# Patient Record
Sex: Female | Born: 1985
Health system: Southern US, Community
[De-identification: ages and names within clinical notes are randomized; demographics above are authoritative.]

---

## 2018-11-05 MED FILL — ONDANSETRON HCL 4 MG TABLET: 4 | 4 days supply | Qty: 15 | Fill #0

## 2019-05-16 ENCOUNTER — Emergency Department (HOSPITAL_COMMUNITY): Payer: PRIVATE HEALTH INSURANCE

## 2019-05-16 ENCOUNTER — Other Ambulatory Visit: Payer: Self-pay

## 2019-05-16 ENCOUNTER — Encounter (HOSPITAL_COMMUNITY): Payer: Self-pay | Admitting: Emergency Medicine

## 2019-05-16 ENCOUNTER — Emergency Department (HOSPITAL_COMMUNITY)
Admission: EM | Admit: 2019-05-16 | Discharge: 2019-05-17 | Disposition: A | Discharge status: Home / Self Care | Payer: PRIVATE HEALTH INSURANCE | Attending: Emergency Medicine | Admitting: Emergency Medicine

## 2019-05-16 DIAGNOSIS — R0602 Shortness of breath: Secondary | ICD-10-CM | POA: Insufficient documentation

## 2019-05-16 DIAGNOSIS — R0781 Pleurodynia: Secondary | ICD-10-CM | POA: Diagnosis not present

## 2019-05-16 DIAGNOSIS — R0789 Other chest pain: Secondary | ICD-10-CM | POA: Diagnosis present

## 2019-05-16 DIAGNOSIS — Z20828 Contact with and (suspected) exposure to other viral communicable diseases: Secondary | ICD-10-CM | POA: Diagnosis not present

## 2019-05-16 LAB — I-STAT BETA HCG BLOOD, ED (MC, WL, AP ONLY): I-stat hCG, quantitative: <5 m[IU]/mL (ref <5)

## 2019-05-16 NOTE — ED Provider Notes (Signed)
TIME SEEN: 11:26 PM  CHIEF COMPLAINT: Chest pain, shortness of breath  HPI: Patient is a 33 year old female with no significant past medical history who presents to the emergency department with 2 days of chest pain and shortness of breath.  States the pain is a pressure in the center of her chest that has radiated into her back that is worse with deep inspiration.  States that she feels short of breath especially lying flat.  States she is normally very active and exercises regularly.  She has not exercise though since the symptoms have started.  She has a [place] IUD but no exogenous estrogen.  She is not a smoker.  She has never had a PE or DVT before.  No recent prolonged immobilization such as long travel, surgery, fracture or hospitalization.  She denies any fevers although does have a temperature here of 99.  She denies any cough.  Patient works as an Geneticist, molecular.  She is exposed to coronavirus patient.  ROS: See HPI Constitutional: no fever  Eyes: no drainage  ENT: no runny nose   Cardiovascular:   chest pain  Resp:  SOB  GI: no vomiting GU: no dysuria Integumentary: no rash  Allergy: no hives  Musculoskeletal: no leg swelling  Neurological: no slurred speech ROS otherwise negative  PAST MEDICAL HISTORY/PAST SURGICAL HISTORY:  History reviewed. No pertinent past medical history.  MEDICATIONS:  Prior to Admission medications   Not on File    ALLERGIES:  No Known Allergies  SOCIAL HISTORY:  Social History   Tobacco Use  . Smoking status: Never Smoker  . Smokeless tobacco: Never Used  Substance Use Topics  . Alcohol use: Yes    FAMILY HISTORY: No family history on file.  EXAM: BP 119/80 (BP Location: Right Arm)   Pulse 88   Temp 99 F (37.2 C) (Oral)   Resp 16   Ht 5\' 7"  (1.702 m)   Wt 74.8 kg   LMP  (Exact Date)   SpO2 100%   BMI 25.84 kg/m  CONSTITUTIONAL: Alert and oriented and responds appropriately to questions.  Well-appearing; well-nourished HEAD: Normocephalic EYES: Conjunctivae clear, pupils appear equal ENT: normal nose; moist mucous membranes NECK: Supple, normal range of motion CARD: RRR; S1 and S2 appreciated; no murmurs, no clicks, no rubs, no gallops RESP: Normal chest excursion without splinting or tachypnea; breath sounds clear and equal bilaterally; no wheezes, no rhonchi, no rales, no hypoxia or respiratory distress, speaking full sentences ABD/GI: Nondistended BACK:  The back appears normal with normal range of motion EXT: Normal ROM in all joints; non-tender to palpation; no edema; normal capillary refill; no cyanosis, no calf tenderness or swelling    SKIN: Normal color for age and race; warm; no rash NEURO: Moves all extremities equally PSYCH: The patient's mood and manner are appropriate. Grooming and personal hygiene are appropriate.  MEDICAL DECISION MAKING: Patient here with pleuritic chest pain.  Differential includes PE, coronavirus, pneumonia, pleurisy.  Less likely ACS given patient is otherwise healthy and exercises regularly.  Will obtain labs, chest x-ray.  ED PROGRESS: Patient's EKG shows no ischemic abnormality.  Labs are reassuring.  D-dimer negative.  Troponin negative.  Chest x-ray clear.  I have recommended coronavirus testing given patient works here in the hospital and has a temperature of 99.  She agrees to this plan.  She is out of work for the next several days and will follow-up on her results through my chart.  Have given Toradol here  for pain control and recommended alternating Tylenol and ibuprofen at home.  She states Tylenol did help her pain at home.  Discussed return precautions.  I feel patient is safe for discharge.   At this time, I do not feel there is any life-threatening condition present. I have reviewed and discussed all results (EKG, imaging, lab, urine as appropriate) and exam findings with patient/family. I have reviewed nursing notes and  appropriate previous records.  I feel the patient is safe to be discharged home without further emergent workup and can continue workup as an outpatient as needed. Discussed usual and customary return precautions. Patient/family verbalize understanding and are comfortable with this plan.  Outpatient follow-up has been provided as needed. All questions have been answered.      EKG Interpretation  Date/Time:  Thursday May 16 2019 [id number] EDT Ventricular Rate:  70 PR Interval:    QRS Duration: 108 QT Interval:  388 QTC Calculation: 419 R Axis:   57 Text Interpretation:  Sinus rhythm RSR' in V1 or V2, right VCD or RVH No old tracing to compare Confirmed by Dione BoozeGlick, David ([id number]) on 05/16/2019 11:11:51 PM         , Layla MawKristen N, DO 05/17/19 0122

## 2019-05-17 LAB — CBC WITH DIFFERENTIAL/PLATELET
Abs Immature Granulocytes: 0.05 10*3/uL (ref 0.00–0.07)
Basophils Absolute: 0.1 10*3/uL (ref 0.0–0.1)
Basophils Relative: 1 %
Eosinophils Absolute: 0.1 10*3/uL (ref 0.0–0.5)
Eosinophils Relative: 1 %
HCT: 37.7 % (ref 36.0–46.0)
Hemoglobin: 13 g/dL (ref 12.0–15.0)
Immature Granulocytes: 1 %
Lymphocytes Relative: 41 %
Lymphs Abs: 3.3 10*3/uL (ref 0.7–4.0)
MCH: 30.3 pg (ref 26.0–34.0)
MCHC: 34.5 g/dL (ref 30.0–36.0)
MCV: 87.9 fL (ref 80.0–100.0)
Monocytes Absolute: 0.7 10*3/uL (ref 0.1–1.0)
Monocytes Relative: 9 %
Neutro Abs: 3.8 10*3/uL (ref 1.7–7.7)
Neutrophils Relative %: 47 %
Platelets: 331 10*3/uL (ref 150–400)
RBC: 4.29 MIL/uL (ref 3.87–5.11)
RDW: 12.2 % (ref 11.5–15.5)
WBC: 8 10*3/uL (ref 4.0–10.5)
nRBC: 0 % (ref 0.0–0.2)

## 2019-05-17 LAB — BASIC METABOLIC PANEL
Anion gap: 9 (ref 5–15)
BUN: 11 mg/dL (ref 6–20)
CO2: 24 mmol/L (ref 22–32)
Calcium: 9.4 mg/dL (ref 8.9–10.3)
Chloride: 105 mmol/L (ref 98–111)
Creatinine, Ser: 0.68 mg/dL (ref 0.44–1.00)
GFR calc Af Amer: >60 mL/min (ref >60)
GFR calc non Af Amer: >60 mL/min (ref >60)
Glucose, Bld: 89 mg/dL (ref 70–99)
Potassium: 3.5 mmol/L (ref 3.5–5.1)
Sodium: 138 mmol/L (ref 135–145)

## 2019-05-17 LAB — TROPONIN I: Troponin I: <0.03 ng/mL (ref <0.03)

## 2019-05-17 LAB — D-DIMER, QUANTITATIVE: D-Dimer, Quant: 0.29 ug/mL-FEU (ref 0.00–0.50)

## 2019-05-17 MED ORDER — KETOROLAC TROMETHAMINE 30 MG/ML IJ SOLN
60.0000 mg | Freq: Once | INTRAMUSCULAR | Status: AC
  Administered 2019-05-17: 60 mg via INTRAMUSCULAR
  Filled 2019-05-17: qty 2

## 2019-05-17 NOTE — Discharge Instructions (Addendum)
You may alternate Tylenol 1000 mg every 6 hours as needed for pain and Ibuprofen 800 mg every 8 hours as needed for pain.  Please take Ibuprofen with food. ° °

## 2019-05-18 LAB — NOVEL CORONAVIRUS, NAA (HOSP ORDER, SEND-OUT TO REF LAB; TAT 18-24 HRS): SARS-CoV-2, NAA: NOT DETECTED

## 2019-10-21 IMAGING — CR CHEST - 2 VIEW
2 series · 2 of 2 positions shown · non-contrast
Comparison: None.

CLINICAL DATA: Chest pain, short of breath

EXAM:
CHEST - 2 VIEW

[chest pa]
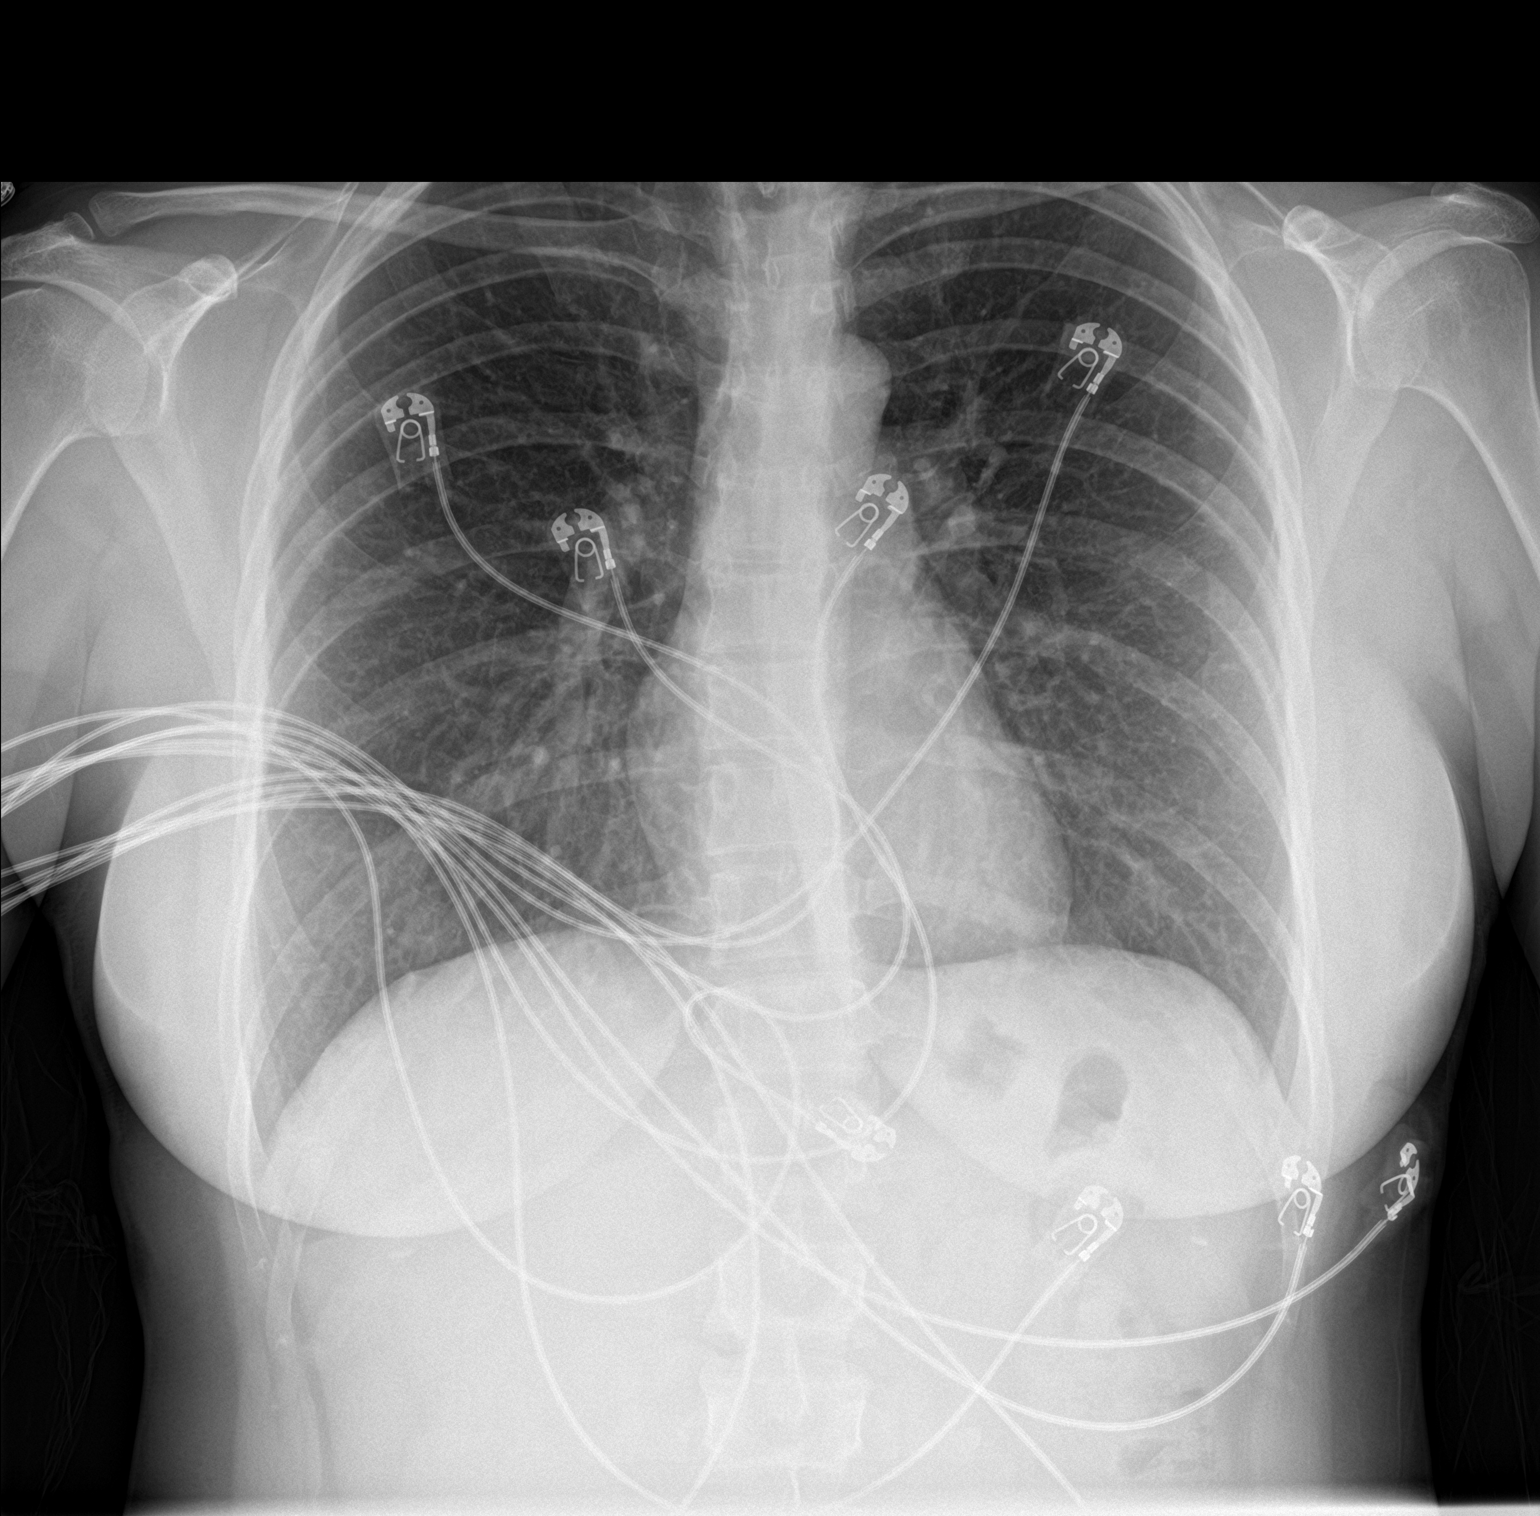

[chest lat]
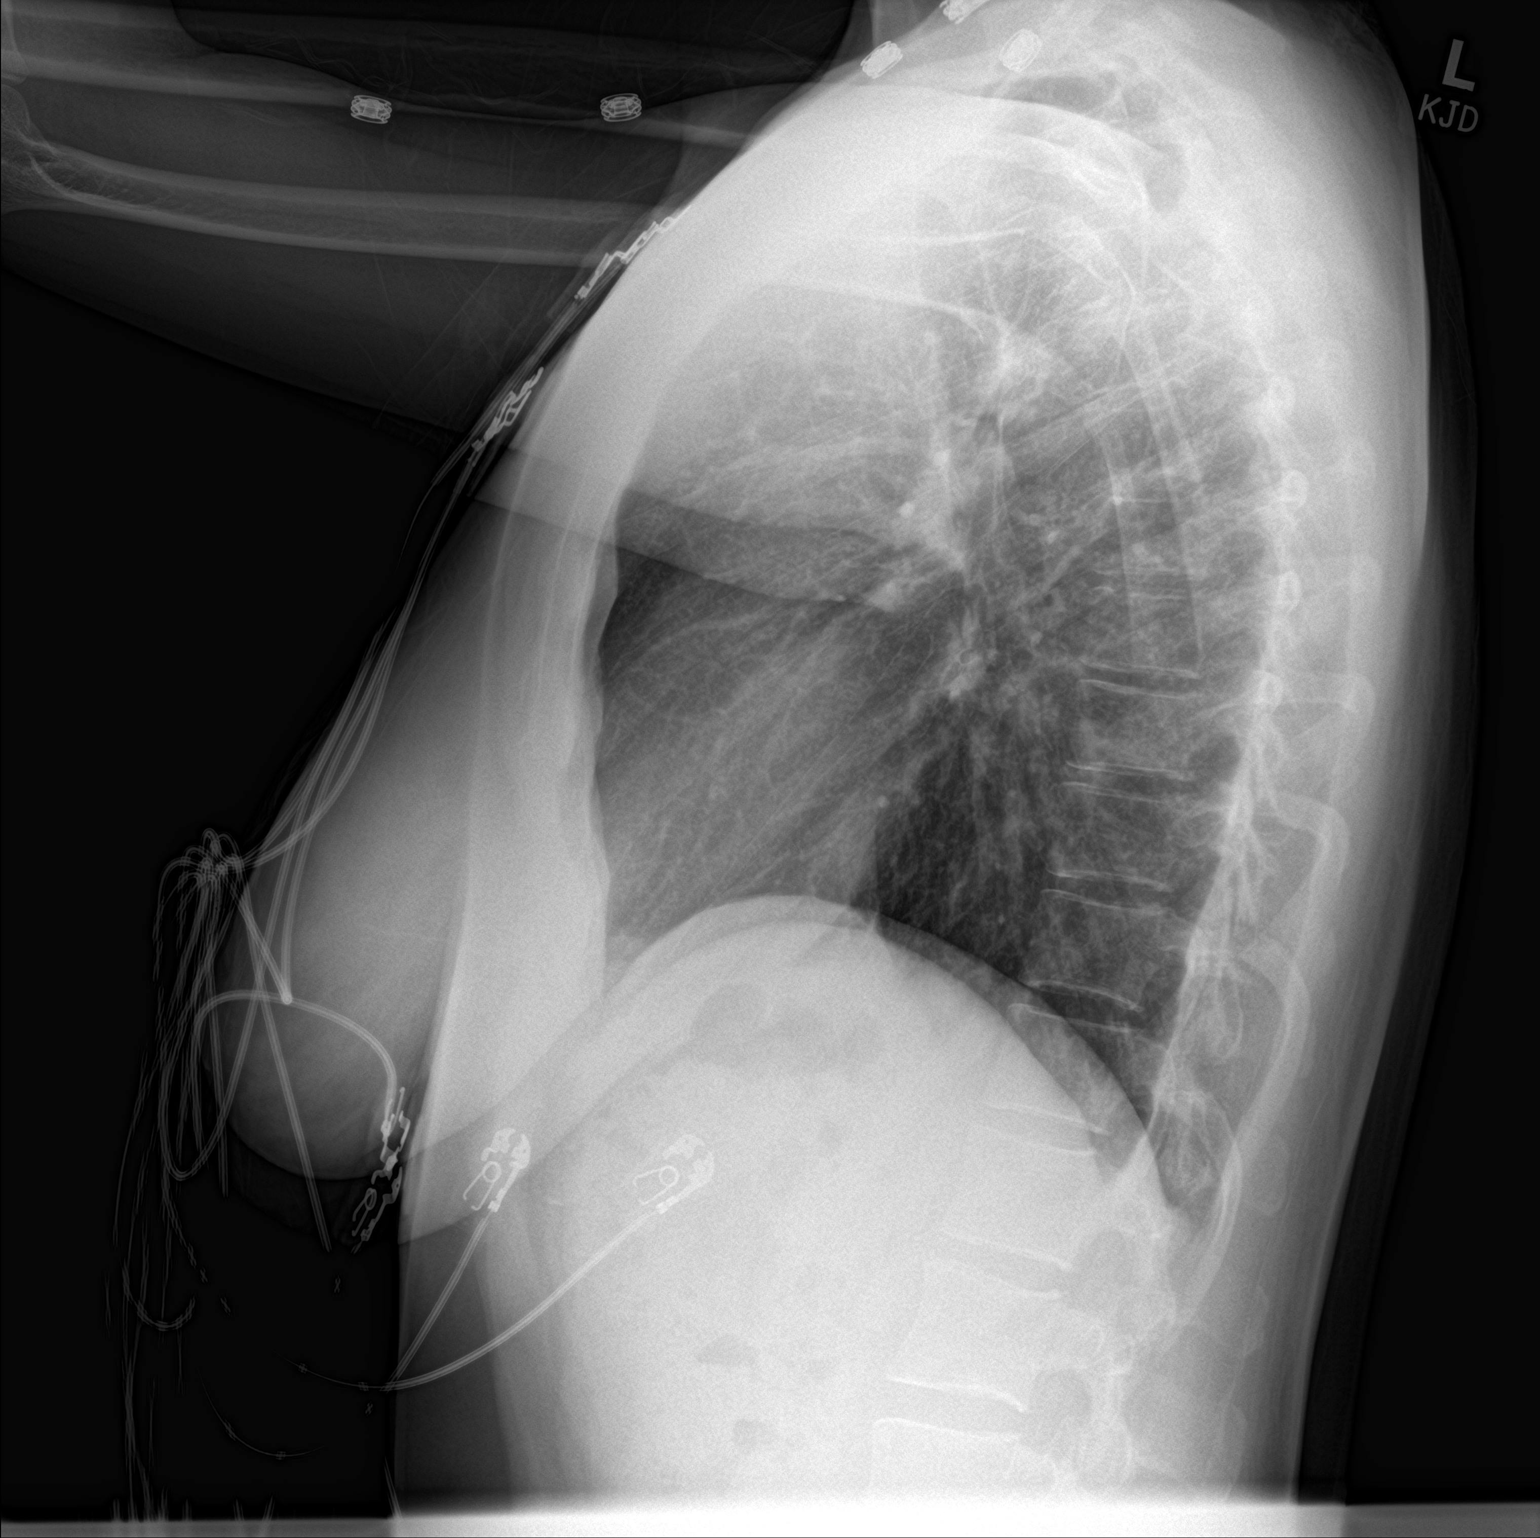

[2 of 2 positions shown; findings below may reference images not displayed]

FINDINGS: The heart size and mediastinal contours are within normal limits.
Both lungs are clear. The visualized skeletal structures are
unremarkable.
IMPRESSION: No active cardiopulmonary disease.

## 2021-09-29 ENCOUNTER — Other Ambulatory Visit (HOSPITAL_BASED_OUTPATIENT_CLINIC_OR_DEPARTMENT_OTHER): Payer: Self-pay

## 2021-09-29 MED ORDER — INFLUENZA VAC SPLIT QUAD 0.5 ML IM SUSY
PREFILLED_SYRINGE | INTRAMUSCULAR | 0 refills | Status: AC
  Filled 2021-09-29: qty 0.5, 1d supply, fill #0

## 2023-09-06 ENCOUNTER — Other Ambulatory Visit (HOSPITAL_BASED_OUTPATIENT_CLINIC_OR_DEPARTMENT_OTHER): Payer: Self-pay

## 2023-09-06 MED ORDER — INFLUENZA VIRUS VACC SPLIT PF (FLUZONE) 0.5 ML IM SUSY
0.5000 mL | PREFILLED_SYRINGE | Freq: Once | INTRAMUSCULAR | 0 refills | Status: AC
  Filled 2023-09-06: qty 0.5, 1d supply, fill #0

## 2024-10-02 ENCOUNTER — Other Ambulatory Visit (HOSPITAL_BASED_OUTPATIENT_CLINIC_OR_DEPARTMENT_OTHER): Payer: Self-pay

## 2024-10-02 MED ORDER — FLUZONE 0.5 ML IM SUSY
0.5000 mL | PREFILLED_SYRINGE | Freq: Once | INTRAMUSCULAR | 0 refills | Status: AC
  Filled 2024-10-02: qty 0.5, 1d supply, fill #0
